# Patient Record
Sex: Male | Born: 1980 | Race: Black or African American | Hispanic: No | Marital: Single | Smoking: Current every day smoker
Health system: Southern US, Community
[De-identification: ages and names within clinical notes are randomized; demographics above are authoritative.]

## PROBLEM LIST (undated history)

## (undated) HISTORY — PX: FOOT SURGERY: SHX648

---

## 2008-02-01 ENCOUNTER — Emergency Department (HOSPITAL_COMMUNITY): Admission: EM | Admit: 2008-02-01 | Discharge: 2008-02-01 | Payer: Self-pay | Admitting: Emergency Medicine

## 2008-12-24 ENCOUNTER — Emergency Department (HOSPITAL_COMMUNITY): Admission: EM | Admit: 2008-12-24 | Discharge: 2008-12-24 | Payer: Self-pay | Admitting: Emergency Medicine

## 2014-12-28 ENCOUNTER — Encounter (HOSPITAL_COMMUNITY): Payer: Self-pay | Admitting: Emergency Medicine

## 2014-12-28 ENCOUNTER — Emergency Department (HOSPITAL_COMMUNITY)
Admission: EM | Admit: 2014-12-28 | Discharge: 2014-12-28 | Disposition: A | Discharge status: Home / Self Care | Payer: Self-pay | Attending: Emergency Medicine | Admitting: Emergency Medicine

## 2014-12-28 DIAGNOSIS — N342 Other urethritis: Secondary | ICD-10-CM | POA: Insufficient documentation

## 2014-12-28 LAB — URINALYSIS, ROUTINE W REFLEX MICROSCOPIC
Bilirubin Urine: NEGATIVE
Glucose, UA: NEGATIVE mg/dL
Hgb urine dipstick: NEGATIVE
KETONES UR: NEGATIVE mg/dL
Leukocytes, UA: NEGATIVE
NITRITE: NEGATIVE
PH: 5.5 (ref 5.0–8.0)
Protein, ur: NEGATIVE mg/dL
Specific Gravity, Urine: >1.03 — ABNORMAL HIGH (ref 1.005–1.030)
Urobilinogen, UA: 0.2 mg/dL (ref 0.0–1.0)

## 2014-12-28 MED ORDER — AZITHROMYCIN 250 MG PO TABS
1000.0000 mg | ORAL_TABLET | Freq: Once | ORAL | Status: AC
  Administered 2014-12-28: 1000 mg via ORAL
  Filled 2014-12-28: qty 4

## 2014-12-28 MED ORDER — CEFTRIAXONE SODIUM 250 MG IJ SOLR
250.0000 mg | Freq: Once | INTRAMUSCULAR | Status: AC
  Administered 2014-12-28: 250 mg via INTRAMUSCULAR
  Filled 2014-12-28: qty 250

## 2014-12-28 MED ORDER — LIDOCAINE HCL (PF) 1 % IJ SOLN
INTRAMUSCULAR | Status: AC
  Administered 2014-12-28: 5 mL
  Filled 2014-12-28: qty 5

## 2014-12-28 NOTE — Discharge Instructions (Signed)
Urethritis °Urethritis is an inflammation of the tube through which urine exits your bladder (urethra).  °CAUSES °Urethritis is often caused by an infection in your urethra. The infection can be viral, like herpes. The infection can also be bacterial, like gonorrhea. °RISK FACTORS °Risk factors of urethritis include: °· Having sex without using a condom. °· Having multiple sexual partners. °· Having poor hygiene. °SIGNS AND SYMPTOMS °Symptoms of urethritis are less noticeable in women than in men. These symptoms include: °· Burning feeling when you urinate (dysuria). °· Discharge from your urethra. °· Blood in your urine (hematuria). °· Urinating more than usual. °DIAGNOSIS  °To confirm a diagnosis of urethritis, your health care provider will do the following: °· Ask about your sexual history. °· Perform a physical exam. °· Have you provide a sample of your urine for lab testing. °· Use a cotton swab to gently collect a sample from your urethra for lab testing. °TREATMENT  °It is important to treat urethritis. Depending on the cause, untreated urethritis may lead to serious genital infections and possibly infertility. Urethritis caused by a bacterial infection is treated with antibiotic medicine. All sexual partners must be treated.  °HOME CARE INSTRUCTIONS °· Do not have sex until the test results are known and treatment is completed, even if your symptoms go away before you finish treatment. °· If you were prescribed an antibiotic, finish it all even if you start to feel better. °SEEK MEDICAL CARE IF:  °· Your symptoms are not improved in 3 days. °· Your symptoms are getting worse. °· You develop abdominal pain or pelvic pain (in women). °· You develop joint pain. °· You have a fever. °SEEK IMMEDIATE MEDICAL CARE IF:  °· You have severe pain in the belly, back, or side. °· You have repeated vomiting. °MAKE SURE YOU: °· Understand these instructions. °· Will watch your condition. °· Will get help right away if you  are not doing well or get worse. °Document Released: 11/30/2000 Document Revised: 10/21/2013 Document Reviewed: 02/04/2013 °ExitCare® Patient Information ©2015 [employer]. This information is not intended to replace advice given to you by your health care provider. Make sure you discuss any questions you have with your health care provider. ° °

## 2014-12-28 NOTE — ED Provider Notes (Signed)
CSN: [id number]     Arrival date & time 12/28/14  1904 History   This chart was scribed for Adam Ausammy Clemmie Buelna, PA-C working with Glynn OctaveStephen Rancour, MD by Elveria Risingimelie Horne, ED Scribe. This patient was seen in room APFT21/APFT21 and the patient's care was started at 7:43 PM.   Chief Complaint  Patient presents with  . SEXUALLY TRANSMITTED DISEASE   The history is provided by the patient. No language interpreter was used.   HPI Comments: Jefm Pettyljamar Drees is a 34 y.o. male who presents to the Emergency Department complaining of white penile discharge which began four days ago. Also reports some burning with urination.   Patient denies penile pain, abdominal pain, nasuea, genital rash or lesions.   Also no testicle swelling or pain.  Patient suspects he has Chlamydia, stating his symptoms are similar to prior episode.   History reviewed. No pertinent past medical history. Past Surgical History  Procedure Laterality Date  . Foot surgery Left    History reviewed. No pertinent family history. History  Substance Use Topics  . Smoking status: Never Smoker   . Smokeless tobacco: Not on file  . Alcohol Use: Yes     Comment: occasional    Review of Systems  Constitutional: Negative for fever, chills and appetite change.  Gastrointestinal: Negative for nausea, vomiting and abdominal pain.  Genitourinary: Positive for discharge. Negative for dysuria, flank pain, penile swelling, scrotal swelling, genital sores, penile pain and testicular pain.  Musculoskeletal: Negative for back pain.  Skin: Negative for rash.  All other systems reviewed and are negative.   Allergies  Review of patient's allergies indicates no known allergies.  Home Medications   Prior to Admission medications   Not on File   Triage Vitals: BP 119/62 mmHg  Pulse 64  Temp(Src) 98.6 F (37 C) (Oral)  Resp 16  Ht 6\' 2"  (1.88 m)  Wt 175 lb (79.379 kg)  BMI 22.46 kg/m2  SpO2 97% Physical Exam  Constitutional: He is oriented to  person, place, and time. He appears well-developed and well-nourished. No distress.  HENT:  Head: Normocephalic and atraumatic.  Mouth/Throat: Oropharynx is clear and moist.  Eyes: EOM are normal.  Neck: Neck supple. No tracheal deviation present.  Cardiovascular: Normal rate and regular rhythm.   No murmur heard. Pulmonary/Chest: Effort normal. No respiratory distress.  Abdominal: Soft. He exhibits no distension. There is no tenderness. There is no rebound.  Genitourinary: Testes normal and penis normal. Cremasteric reflex is present. Right testis shows no mass, no swelling and no tenderness. Left testis shows no mass, no swelling and no tenderness. Circumcised. No penile erythema or penile tenderness. No discharge found.  Chaperoned exam.  No obvious penile discharge seen.  No testicular masses or scrotal tenderness.    Musculoskeletal: Normal range of motion.  Neurological: He is alert and oriented to person, place, and time.  Skin: Skin is warm and dry.  Psychiatric: He has a normal mood and affect. His behavior is normal.  Nursing note and vitals reviewed.   ED Course  Procedures (including critical care time)  COORDINATION OF CARE: 7:45 PM- Discussed treatment plan with patient at bedside and patient agreed to plan.   Labs Review Labs Reviewed - No data to display  Imaging Review No results found.   EKG Interpretation None      MDM   Final diagnoses:  Urethritis   Pt with intermittent penile d/c.  Otherwise well appearing, no rash, testicle swelling or pain.  Treated with IM  rocephin and zithromax.  Cultures pending.  Advised to arrange f/u with health dept if needed.    I personally performed the services described in this documentation, which was scribed in my presence. The recorded information has been reviewed and is accurate.    Adam Aus, PA-C 12/30/14 1245  Glynn Octave, MD 12/30/14 [phone]

## 2014-12-28 NOTE — ED Notes (Signed)
Patient states has noticed some white discharge from penis x 2 days along with "irritation" per patient. Denies odor.

## 2014-12-30 LAB — RPR: RPR Ser Ql: NONREACTIVE

## 2014-12-30 LAB — GC/CHLAMYDIA PROBE AMP (~~LOC~~) NOT AT ARMC
Chlamydia: POSITIVE — AB
Neisseria Gonorrhea: NEGATIVE

## 2014-12-31 ENCOUNTER — Telehealth: Payer: Self-pay | Admitting: Emergency Medicine

## 2015-01-01 ENCOUNTER — Telehealth (HOSPITAL_COMMUNITY): Payer: Self-pay

## 2015-01-02 ENCOUNTER — Telehealth (HOSPITAL_COMMUNITY): Payer: Self-pay

## 2015-01-02 NOTE — ED Notes (Signed)
Unable to reach by telephone. Letter sent to address on record.  

## 2016-08-02 ENCOUNTER — Emergency Department (HOSPITAL_COMMUNITY): Payer: Self-pay

## 2016-08-02 ENCOUNTER — Encounter (HOSPITAL_COMMUNITY): Payer: Self-pay | Admitting: Emergency Medicine

## 2016-08-02 ENCOUNTER — Emergency Department (HOSPITAL_COMMUNITY)
Admission: EM | Admit: 2016-08-02 | Discharge: 2016-08-03 | Disposition: A | Discharge status: Home / Self Care | Payer: Self-pay | Attending: Emergency Medicine | Admitting: Emergency Medicine

## 2016-08-02 DIAGNOSIS — M791 Myalgia: Secondary | ICD-10-CM | POA: Insufficient documentation

## 2016-08-02 DIAGNOSIS — J111 Influenza due to unidentified influenza virus with other respiratory manifestations: Secondary | ICD-10-CM

## 2016-08-02 DIAGNOSIS — R05 Cough: Secondary | ICD-10-CM | POA: Insufficient documentation

## 2016-08-02 DIAGNOSIS — R112 Nausea with vomiting, unspecified: Secondary | ICD-10-CM | POA: Insufficient documentation

## 2016-08-02 DIAGNOSIS — F17200 Nicotine dependence, unspecified, uncomplicated: Secondary | ICD-10-CM | POA: Insufficient documentation

## 2016-08-02 DIAGNOSIS — R509 Fever, unspecified: Secondary | ICD-10-CM | POA: Insufficient documentation

## 2016-08-02 DIAGNOSIS — J029 Acute pharyngitis, unspecified: Secondary | ICD-10-CM | POA: Insufficient documentation

## 2016-08-02 DIAGNOSIS — R69 Illness, unspecified: Secondary | ICD-10-CM

## 2016-08-02 DIAGNOSIS — J3489 Other specified disorders of nose and nasal sinuses: Secondary | ICD-10-CM | POA: Insufficient documentation

## 2016-08-02 LAB — CBC WITH DIFFERENTIAL/PLATELET
Basophils Absolute: 0 10*3/uL (ref 0.0–0.1)
Basophils Relative: 0 %
Eosinophils Absolute: 0.1 10*3/uL (ref 0.0–0.7)
Eosinophils Relative: 1 %
HCT: 43.9 % (ref 39.0–52.0)
HEMOGLOBIN: 15.6 g/dL (ref 13.0–17.0)
Lymphocytes Relative: 4 %
Lymphs Abs: 0.4 10*3/uL — ABNORMAL LOW (ref 0.7–4.0)
MCH: 29.9 pg (ref 26.0–34.0)
MCHC: 35.5 g/dL (ref 30.0–36.0)
MCV: 84.3 fL (ref 78.0–100.0)
Monocytes Absolute: 0.7 10*3/uL (ref 0.1–1.0)
Monocytes Relative: 7 %
NEUTROS PCT: 88 %
Neutro Abs: 8.8 10*3/uL — ABNORMAL HIGH (ref 1.7–7.7)
Platelets: 250 10*3/uL (ref 150–400)
RBC: 5.21 MIL/uL (ref 4.22–5.81)
RDW: 12.8 % (ref 11.5–15.5)
WBC: 10 10*3/uL (ref 4.0–10.5)

## 2016-08-02 LAB — BASIC METABOLIC PANEL
Anion gap: 11 (ref 5–15)
BUN: 10 mg/dL (ref 6–20)
CO2: 27 mmol/L (ref 22–32)
Calcium: 10.5 mg/dL — ABNORMAL HIGH (ref 8.9–10.3)
Chloride: 99 mmol/L — ABNORMAL LOW (ref 101–111)
Creatinine, Ser: 1.07 mg/dL (ref 0.61–1.24)
GFR calc Af Amer: >60 mL/min (ref >60)
GFR calc non Af Amer: >60 mL/min (ref >60)
GLUCOSE: 105 mg/dL — AB (ref 65–99)
POTASSIUM: 3.8 mmol/L (ref 3.5–5.1)
Sodium: 137 mmol/L (ref 135–145)

## 2016-08-02 MED ORDER — SODIUM CHLORIDE 0.9 % IV BOLUS (SEPSIS)
1000.0000 mL | Freq: Once | INTRAVENOUS | Status: AC
  Administered 2016-08-02: 1000 mL via INTRAVENOUS

## 2016-08-02 MED ORDER — ONDANSETRON HCL 4 MG/2ML IJ SOLN
4.0000 mg | Freq: Once | INTRAMUSCULAR | Status: AC
  Administered 2016-08-02: 4 mg via INTRAVENOUS
  Filled 2016-08-02: qty 2

## 2016-08-02 NOTE — ED Provider Notes (Signed)
[facility]-EM[facility]Y DEPT Provider Note   CSN: [id number] Arrival date & time: 08/02/16  1950  By signing my name below, I, Rosario AdieWilliam Andrew Hiatt, attest that this documentation has been prepared under the direction and in the presence of Devoria AlbeIva Seab Axel, MD. Electronically Signed: Rosario AdieWilliam Andrew Hiatt, ED Scribe. 08/02/16. 11:26 PM.  Time seen 23:19 PM  History   Chief Complaint Chief Complaint  Patient presents with  . Fever  . Emesis   The history is provided by the patient. No language interpreter was used.    HPI Comments: Adam Chandler is a 36 y.o. male with no pertinent PMHx, who presents to the Emergency Department complaining of gradually worsening subjective fever beginning two days ago. He notes associated  generalized myalgias, chills, nausea, vomiting x 7 today, non-productive cough, mild sore throat, clear/yellow rhinorrhea.  No antipyretics or other treatments for his symptoms were tried prior to coming into the ED since this morning. His nausea and vomiting are exacerbated following PO intake. No known direct sick contacts with similar symptoms. Pt did not receive their seasonal influenza vaccination this year. Pt is an occasional smoker at 1pp 4-5 days. He denies diarrhea, abdominal pain, chest pain or any other associated symptoms.   PCP none  History reviewed. No pertinent past medical history.  There are no active problems to display for this patient.  Past Surgical History:  Procedure Laterality Date  . FOOT SURGERY Left     Home Medications    Prior to Admission medications   Medication Sig Start Date End Date Taking? Authorizing Provider  Pseudoephedrine-APAP-DM (DAYQUIL MULTI-SYMPTOM COLD/FLU PO) Take 2 capsules by mouth daily as needed (for flu smyptoms).   Yes Historical Provider, MD  ondansetron (ZOFRAN) 4 MG tablet Take 1 tablet (4 mg total) by mouth every 8 (eight) hours as needed. 08/03/16   Devoria AlbeIva Liller Yohn, MD  oseltamivir (TAMIFLU) 75 MG capsule Take 1 capsule (75  mg total) by mouth every 12 (twelve) hours. 08/03/16   Devoria AlbeIva Ceara Wrightson, MD   Family History History reviewed. No pertinent family history.  Social History Social History  Substance Use Topics  . Smoking status: Current Every Day Smoker  . Smokeless tobacco: Never Used  . Alcohol use Yes     Comment: occasional  employed Smokes 1 pp4-5 days   Allergies   Patient has no known allergies.  Review of Systems Review of Systems  Constitutional: Positive for chills and fever.  HENT: Positive for rhinorrhea and sore throat.   Respiratory: Positive for cough.   Gastrointestinal: Positive for nausea and vomiting. Negative for abdominal pain and diarrhea.  Musculoskeletal: Positive for myalgias (generalized).  All other systems reviewed and are negative.  Physical Exam Updated Vital Signs BP 111/61 (BP Location: Left Arm)   Pulse 93   Temp 99 F (37.2 C) (Oral)   Resp 15   Ht 6\' 2"  (1.88 m)   Wt 187 lb (84.8 kg)   SpO2 99%   BMI 24.01 kg/m   Physical Exam  Constitutional: He is oriented to person, place, and time. He appears well-developed and well-nourished.  Non-toxic appearance. He does not appear ill. No distress.  Appears fatigued.   HENT:  Head: Normocephalic and atraumatic.  Right Ear: External ear normal.  Left Ear: External ear normal.  Nose: Nose normal. No mucosal edema or rhinorrhea.  Mouth/Throat: No dental abscesses or uvula swelling.  Posterior oropharynx is erythematous. No tonsillar enlargement or exudates. Tongue dry  Eyes: Conjunctivae and EOM are normal. Pupils are  equal, round, and reactive to light.  Neck: Normal range of motion and full passive range of motion without pain. Neck supple.  Cardiovascular: Normal rate, regular rhythm and normal heart sounds.  Exam reveals no gallop and no friction rub.   No murmur heard. Pulmonary/Chest: Effort normal and breath sounds normal. No respiratory distress. He has no wheezes. He has no rhonchi. He has no rales. He  exhibits no tenderness and no crepitus.  Abdominal: Soft. Normal appearance and bowel sounds are normal. He exhibits no distension. There is no tenderness. There is no rebound and no guarding.  Musculoskeletal: Normal range of motion. He exhibits no edema or tenderness.  Moves all extremities well.   Neurological: He is alert and oriented to person, place, and time. He has normal strength. No cranial nerve deficit.  Skin: Skin is warm, dry and intact. No rash noted. No erythema. No pallor.  Psychiatric: He has a normal mood and affect. His speech is normal and behavior is normal. His mood appears not anxious.  Nursing note and vitals reviewed.  ED Treatments / Results  DIAGNOSTIC STUDIES: Oxygen Saturation is 97% on RA, normal by my interpretation.     Labs (all labs ordered are listed, but only abnormal results are displayed) Results for orders placed or performed during the hospital encounter of 08/02/16  Rapid strep screen  Result Value Ref Range   Streptococcus, Group A Screen (Direct) NEGATIVE NEGATIVE  CBC with Differential  Result Value Ref Range   WBC 10.0 4.0 - 10.5 K/uL   RBC 5.21 4.22 - 5.81 MIL/uL   Hemoglobin 15.6 13.0 - 17.0 g/dL   HCT 29.5 62.1 - 30.8 %   MCV 84.3 78.0 - 100.0 fL   MCH 29.9 26.0 - 34.0 pg   MCHC 35.5 30.0 - 36.0 g/dL   RDW 65.7 84.6 - 96.2 %   Platelets 250 150 - 400 K/uL   Neutrophils Relative % 88 %   Neutro Abs 8.8 (H) 1.7 - 7.7 K/uL   Lymphocytes Relative 4 %   Lymphs Abs 0.4 (L) 0.7 - 4.0 K/uL   Monocytes Relative 7 %   Monocytes Absolute 0.7 0.1 - 1.0 K/uL   Eosinophils Relative 1 %   Eosinophils Absolute 0.1 0.0 - 0.7 K/uL   Basophils Relative 0 %   Basophils Absolute 0.0 0.0 - 0.1 K/uL  Basic metabolic panel  Result Value Ref Range   Sodium 137 135 - 145 mmol/L   Potassium 3.8 3.5 - 5.1 mmol/L   Chloride 99 (L) 101 - 111 mmol/L   CO2 27 22 - 32 mmol/L   Glucose, Bld 105 (H) 65 - 99 mg/dL   BUN 10 6 - 20 mg/dL   Creatinine,  Ser 9.52 0.61 - 1.24 mg/dL   Calcium 84.1 (H) 8.9 - 10.3 mg/dL   GFR calc non Af Amer >60 >60 mL/min   GFR calc Af Amer >60 >60 mL/min   Anion gap 11 5 - 15  Urinalysis, Routine w reflex microscopic  Result Value Ref Range   Color, Urine YELLOW YELLOW   APPearance CLEAR CLEAR   Specific Gravity, Urine 1.023 1.005 - 1.030   pH 6.0 5.0 - 8.0   Glucose, UA NEGATIVE NEGATIVE mg/dL   Hgb urine dipstick NEGATIVE NEGATIVE   Bilirubin Urine NEGATIVE NEGATIVE   Ketones, ur 20 (A) NEGATIVE mg/dL   Protein, ur 30 (A) NEGATIVE mg/dL   Nitrite NEGATIVE NEGATIVE   Leukocytes, UA NEGATIVE NEGATIVE   RBC /  HPF 0-5 0 - 5 RBC/hpf   WBC, UA 0-5 0 - 5 WBC/hpf   Bacteria, UA RARE (A) NONE SEEN   Mucous PRESENT    Laboratory interpretation all normal ketones in urine c/w dehydration    Radiology Dg Chest 2 View  Result Date: 08/03/2016 CLINICAL DATA:  Fever and cough for 2 days. Gradually worsening. Associated tremors, myalgia, chills, nausea, vomiting, cough, sore throat, and rhinorrhea. Current smoker. EXAM: CHEST  2 VIEW COMPARISON:  None. FINDINGS: The heart size and mediastinal contours are within normal limits. Both lungs are clear. The visualized skeletal structures are unremarkable. IMPRESSION: No active cardiopulmonary disease. Electronically Signed   By: Burman Nieves M.D.   On: 08/03/2016 00:10    Procedures Procedures   Medications Ordered in ED Medications  sodium chloride 0.9 % bolus 1,000 mL (0 mLs Intravenous Stopped 08/03/16 0124)  sodium chloride 0.9 % bolus 1,000 mL (0 mLs Intravenous Stopped 08/03/16 0124)  ondansetron (ZOFRAN) injection 4 mg (4 mg Intravenous Given 08/02/16 2357)    Initial Impression / Assessment and Plan / ED Course  I have reviewed the triage vital signs and the nursing notes.  Pertinent labs & imaging results that were available during my care of the patient were reviewed by me and considered in my medical decision making (see chart for  details).   COORDINATION OF CARE: 11:24 PM-Discussed next steps with pt including symptomatic treatment including IVF. Will also order CXR to r/o PNA. Pt verbalized understanding and is agreeable with the plan. We discussed he has influenza-like illness.  Recheck 01:50 AM And has had his IV fluids. He reports he has been able to drink without feeling worse. Patient looks like he feels better than when he first was seen in the ED. We discussed he has influenza. He is to do symptomatic care at home. He should return if he gets worse.   Final Clinical Impressions(s) / ED Diagnoses   Final diagnoses:  Influenza-like illness   New Prescriptions New Prescriptions   ONDANSETRON (ZOFRAN) 4 MG TABLET    Take 1 tablet (4 mg total) by mouth every 8 (eight) hours as needed.   OSELTAMIVIR (TAMIFLU) 75 MG CAPSULE    Take 1 capsule (75 mg total) by mouth every 12 (twelve) hours.   Plan discharge  Devoria Albe, MD, FACEP   I personally performed the services described in this documentation, which was scribed in my presence. The recorded information has been reviewed and considered.  Devoria Albe, MD, Concha Pyo, MD 08/03/16 [phone]

## 2016-08-02 NOTE — ED Triage Notes (Signed)
Patient complaining of vomiting and fever x 2 days, worsening today. States no OTC meds since this morning.

## 2016-08-03 LAB — URINALYSIS, ROUTINE W REFLEX MICROSCOPIC
BILIRUBIN URINE: NEGATIVE
Glucose, UA: NEGATIVE mg/dL
Hgb urine dipstick: NEGATIVE
Ketones, ur: 20 mg/dL — AB
LEUKOCYTES UA: NEGATIVE
NITRITE: NEGATIVE
PROTEIN: 30 mg/dL — AB
Specific Gravity, Urine: 1.023 (ref 1.005–1.030)
pH: 6 (ref 5.0–8.0)

## 2016-08-03 LAB — RAPID STREP SCREEN (MED CTR MEBANE ONLY): Streptococcus, Group A Screen (Direct): NEGATIVE

## 2016-08-03 MED ORDER — OSELTAMIVIR PHOSPHATE 75 MG PO CAPS: 75.0000 mg | ORAL_CAPSULE | Freq: Two times a day (BID) | ORAL | 0 refills | Status: AC

## 2016-08-03 MED ORDER — ONDANSETRON HCL 4 MG PO TABS: 4.0000 mg | ORAL_TABLET | Freq: Three times a day (TID) | ORAL | 0 refills | Status: AC | PRN

## 2016-08-03 NOTE — Discharge Instructions (Signed)
Drink plenty of fluids (clear liquids) then start a bland diet later this morning such as toast, crackers, jello, [employer] chicken noodle soup. Use the zofran for nausea or vomiting. Take the tamiflu until gone. Take ibuprofen 600 mg + acetaminophen 1000 mg every 6 hours for body aches or fever.   Recheck if you get worse, such as shortness of breath, uncontrolled vomiting, get dehydration.

## 2016-08-06 LAB — CULTURE, GROUP A STREP (THRC)

## 2017-12-22 IMAGING — DX DG CHEST 2V
2 series · 2 of 2 positions shown · non-contrast
Comparison: None.

CLINICAL DATA: Fever and cough for 2 days. Gradually worsening.
Associated tremors, myalgia, chills, nausea, vomiting, cough, sore
throat, and rhinorrhea. Current smoker.

EXAM:
CHEST  2 VIEW

[chest pa]
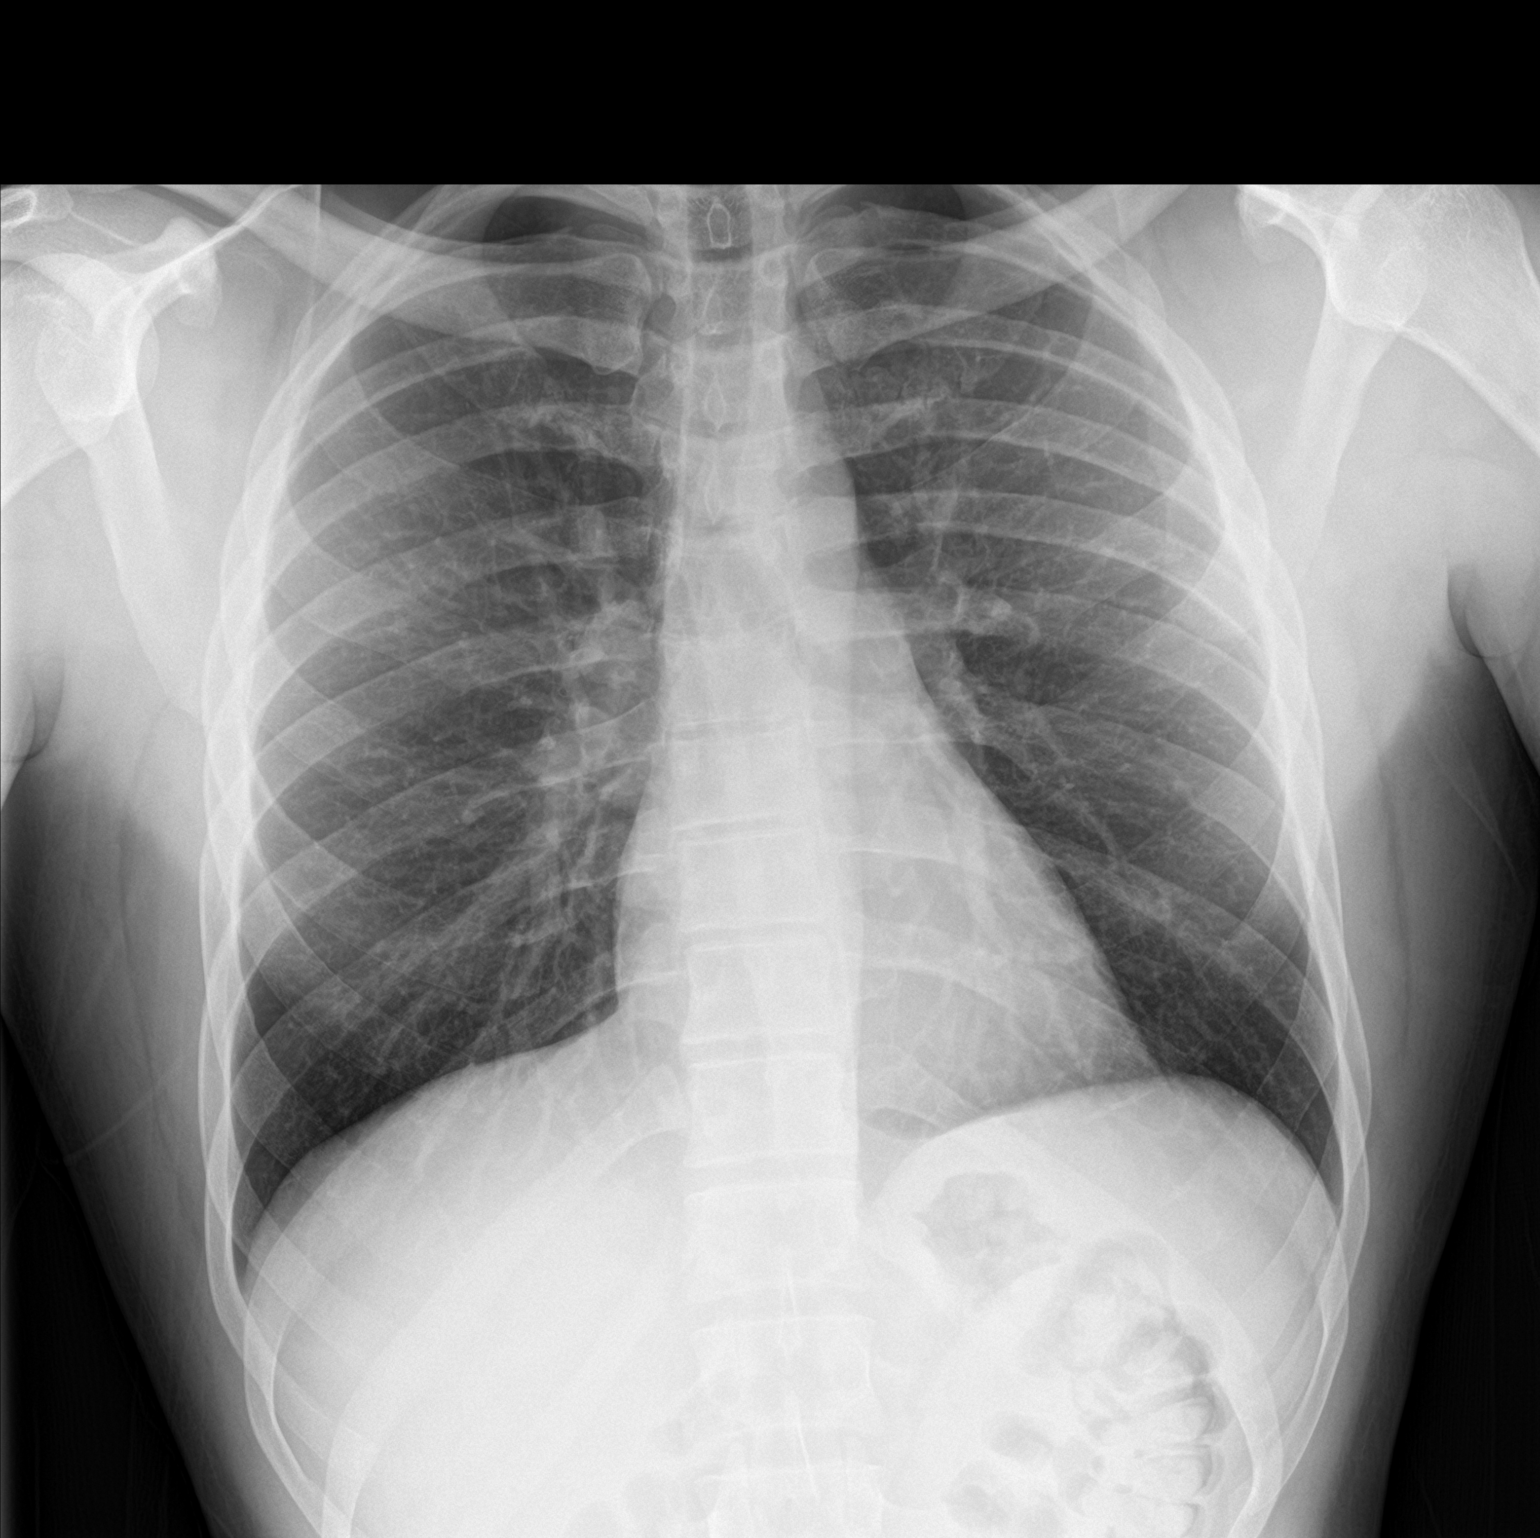

[chest lat]
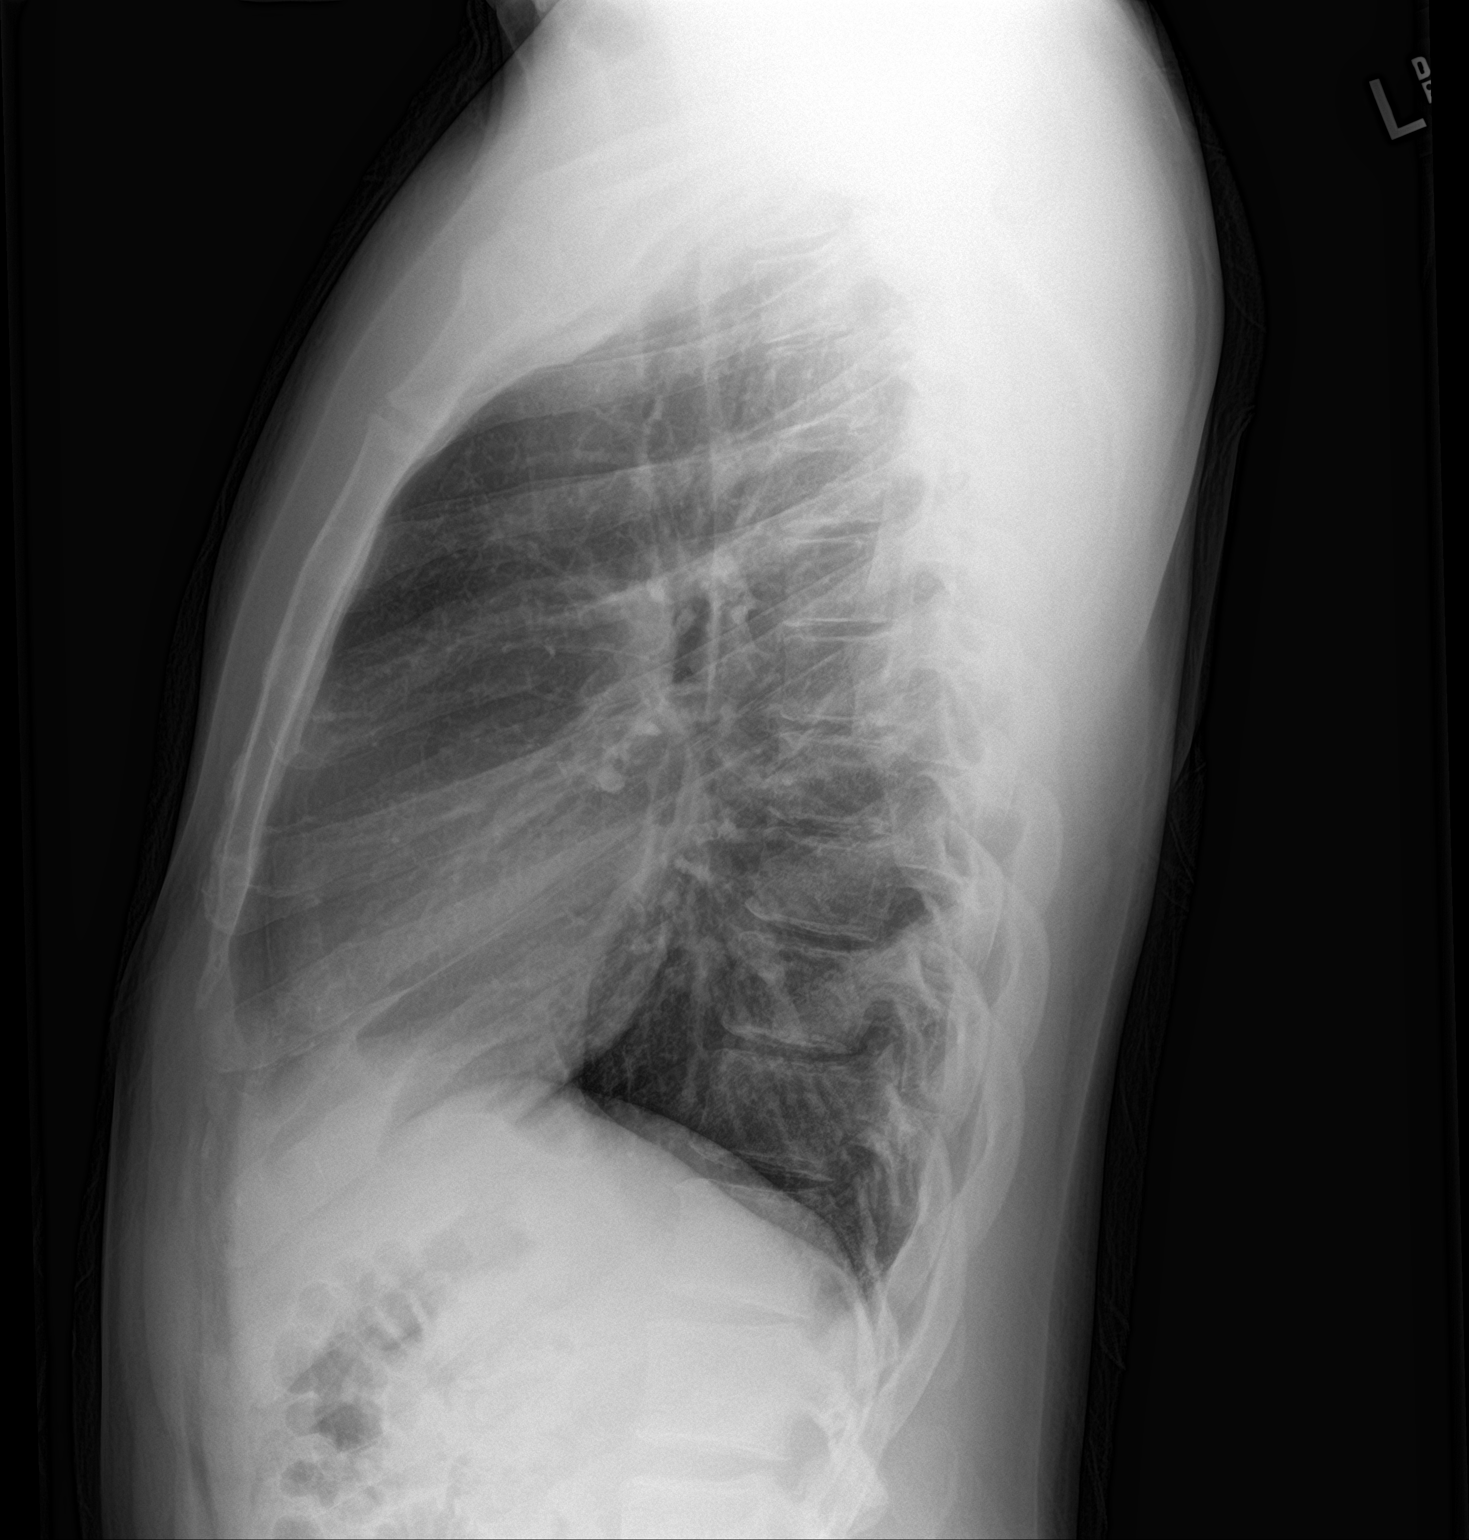

[2 of 2 positions shown; findings below may reference images not displayed]

FINDINGS: The heart size and mediastinal contours are within normal limits.
Both lungs are clear. The visualized skeletal structures are
unremarkable.
IMPRESSION: No active cardiopulmonary disease.

## 2021-05-13 ENCOUNTER — Emergency Department (HOSPITAL_COMMUNITY)
Admission: EM | Admit: 2021-05-13 | Discharge: 2021-05-13 | Disposition: A | Discharge status: Home / Self Care | Payer: Self-pay | Attending: Emergency Medicine | Admitting: Emergency Medicine

## 2021-05-13 ENCOUNTER — Encounter (HOSPITAL_COMMUNITY): Payer: Self-pay

## 2021-05-13 ENCOUNTER — Other Ambulatory Visit: Payer: Self-pay

## 2021-05-13 DIAGNOSIS — Z202 Contact with and (suspected) exposure to infections with a predominantly sexual mode of transmission: Secondary | ICD-10-CM | POA: Insufficient documentation

## 2021-05-13 DIAGNOSIS — F17200 Nicotine dependence, unspecified, uncomplicated: Secondary | ICD-10-CM | POA: Insufficient documentation

## 2021-05-13 MED ORDER — CEFTRIAXONE SODIUM 500 MG IJ SOLR
500.0000 mg | Freq: Once | INTRAMUSCULAR | Status: AC
  Administered 2021-05-13: 500 mg via INTRAMUSCULAR
  Filled 2021-05-13: qty 500

## 2021-05-13 MED ORDER — DOXYCYCLINE HYCLATE 100 MG PO CAPS: 100.0000 mg | ORAL_CAPSULE | Freq: Two times a day (BID) | ORAL | 0 refills | Status: AC

## 2021-05-13 MED ORDER — DOXYCYCLINE HYCLATE 100 MG PO CAPS: 100.0000 mg | ORAL_CAPSULE | Freq: Two times a day (BID) | ORAL | 0 refills | Status: DC

## 2021-05-13 MED ORDER — LIDOCAINE HCL (PF) 1 % IJ SOLN
INTRAMUSCULAR | Status: AC
  Filled 2021-05-13: qty 30

## 2021-05-13 NOTE — ED Triage Notes (Signed)
Pt states that girlfriend test positive for gonorrhea a few days ago. Pt states that he is not having an symptoms but wants to be tested.

## 2021-05-13 NOTE — Discharge Instructions (Signed)
You were evaluated in the Emergency Department and after careful evaluation, we did not find any emergent condition requiring admission or further testing in the hospital.  Your exam/testing today was overall reassuring.  We are treating you for possible gonorrhea/chlamydia infection.  Please take the doxycycline antibiotic as directed.  Please return to the Emergency Department if you experience any worsening of your condition.  Thank you for allowing [place] to be a part of your care.

## 2021-05-13 NOTE — ED Provider Notes (Signed)
[facility]-EMERG[facility]Y DEPT [facility] Emergency Department Provider Note MRN:  [id number]  Arrival date & time: 05/13/21     Chief Complaint   STD exposure History of Present Illness   Adam Chandler is a 40 y.o. year-old male with no pertinent past medical history presenting to the ED with chief complaint of STD exposure.  Patient's wife recently diagnosed with gonorrhea or chlamydia, here to be tested or treated.  Denies any symptoms at all.  No discharge, no fever, no pain.  Last sexual contact 1 week ago.  Review of Systems  A problem-focused ROS was performed. Positive for STD exposure.  Patient denies symptoms.  Patient's Health History   History reviewed. No pertinent past medical history.  Past Surgical History:  Procedure Laterality Date   FOOT SURGERY Left     No family history on file.  Social History   Socioeconomic History   Marital status: Single    Spouse name: Not on file   Number of children: Not on file   Years of education: Not on file   Highest education level: Not on file  Occupational History   Not on file  Tobacco Use   Smoking status: Every Day   Smokeless tobacco: Never  Substance and Sexual Activity   Alcohol use: Yes    Comment: occasional   Drug use: No   Sexual activity: Not on file  Other Topics Concern   Not on file  Social History Narrative   Not on file   Social Determinants of Health   Financial Resource Strain: Not on file  Food Insecurity: Not on file  Transportation Needs: Not on file  Physical Activity: Not on file  Stress: Not on file  Social Connections: Not on file  Intimate Partner Violence: Not on file     Physical Exam   Vitals:   05/13/21 0210 05/13/21 0212  BP: 113/72   Pulse: (!) 53 (!) 57  Resp: 18   Temp: 98.3 F (36.8 C)   SpO2: 96% 99%    CONSTITUTIONAL: Well-appearing, no acute distress NEURO:  Alert and oriented x 3, no focal deficits EYES:  eyes equal and reactive ENT/NECK:  no LAD, no  JVD CARDIO: Regular rate, well-perfused, normal S1 and S2 PULM:  CTAB no wheezing or rhonchi GI/GU:  normal bowel sounds, non-distended, non-tender MSK/SPINE:  No gross deformities, no edema SKIN:  no rash, atraumatic PSYCH:  Appropriate speech and behavior  *Additional and/or pertinent findings included in MDM below  Diagnostic and Interventional Summary    EKG Interpretation  Date/Time:    Ventricular Rate:    PR Interval:    QRS Duration:   QT Interval:    QTC Calculation:   R Axis:     Text Interpretation:         Labs Reviewed  GC/CHLAMYDIA PROBE AMP ([place]) NOT AT [facility]    No orders to display    Medications  cefTRIAXone (ROCEPHIN) injection 500 mg (500 mg Intramuscular Given 05/13/21 0254)  lidocaine (PF) (XYLOCAINE) 1 % injection (  Given 05/13/21 0255)     Procedures  /  Critical Care Procedures  ED Course and Medical Decision Making  I have reviewed the triage vital signs, the nursing notes, and pertinent available records from the EMR.  Listed above are laboratory and imaging tests that I personally ordered, reviewed, and interpreted and then considered in my medical decision making (see below for details).  Asymptomatic, STD exposure, discussed management options including urinary testing versus  empiric treatment.  Patient prefers treatment.  Appropriate for discharge.       Elmer Sow. Pilar Plate, MD [facility] Emergency Medicine [facility] [email or web address]  Final Clinical Impressions(s) / ED Diagnoses     ICD-10-CM   1. STD exposure  Z20.2       ED Discharge Orders          Ordered    doxycycline (VIBRAMYCIN) 100 MG capsule  2 times daily,   Status:  Discontinued        05/13/21 0251    doxycycline (VIBRAMYCIN) 100 MG capsule  2 times daily        05/13/21 0251             Discharge Instructions Discussed with and Provided to Patient:    Discharge Instructions      You were evaluated in the Emergency  Department and after careful evaluation, we did not find any emergent condition requiring admission or further testing in the hospital.  Your exam/testing today was overall reassuring.  We are treating you for possible gonorrhea/chlamydia infection.  Please take the doxycycline antibiotic as directed.  Please return to the Emergency Department if you experience any worsening of your condition.  Thank you for allowing [place] to be a part of your care.        Sabas Sous, MD 05/13/21 [phone]
